# Patient Record
Sex: Male | Born: 1960 | Race: Black or African American | Hispanic: No | Marital: Single | Smoking: Never smoker
Health system: Southern US, Community
[De-identification: ages and names within clinical notes are randomized; demographics above are authoritative.]

---

## 2007-08-14 ENCOUNTER — Ambulatory Visit (HOSPITAL_COMMUNITY): Admission: RE | Admit: 2007-08-14 | Discharge: 2007-08-14 | Payer: Self-pay | Admitting: Family Medicine

## 2007-08-14 ENCOUNTER — Ambulatory Visit: Payer: Self-pay | Admitting: Family Medicine

## 2007-09-26 DIAGNOSIS — T148XXA Other injury of unspecified body region, initial encounter: Secondary | ICD-10-CM | POA: Insufficient documentation

## 2008-04-06 ENCOUNTER — Ambulatory Visit: Payer: Self-pay | Admitting: Family Medicine

## 2008-04-09 ENCOUNTER — Ambulatory Visit (HOSPITAL_COMMUNITY): Admission: RE | Admit: 2008-04-09 | Discharge: 2008-04-09 | Payer: Self-pay | Admitting: Family Medicine

## 2009-01-11 ENCOUNTER — Ambulatory Visit: Payer: Self-pay | Admitting: Family Medicine

## 2010-06-02 ENCOUNTER — Ambulatory Visit: Payer: Self-pay | Admitting: Family Medicine

## 2010-06-02 LAB — CONVERTED CEMR LAB
AST: 17 units/L (ref 0–37)
Alkaline Phosphatase: 65 units/L (ref 39–117)
BUN: 18 mg/dL (ref 6–23)
Basophils Absolute: 0 10*3/uL (ref 0.0–0.1)
Basophils Relative: 1 % (ref 0–1)
Creatinine, Ser: 1.09 mg/dL (ref 0.40–1.50)
Eosinophils Absolute: 0.1 10*3/uL (ref 0.0–0.7)
Eosinophils Relative: 4 % (ref 0–5)
Glucose, Bld: 85 mg/dL (ref 70–99)
HCT: 39.7 % (ref 39.0–52.0)
HDL: 35 mg/dL — ABNORMAL LOW (ref >39)
LDL Cholesterol: 98 mg/dL (ref 0–99)
Lymphocytes Relative: 37 % (ref 12–46)
MCHC: 33.5 g/dL (ref 30.0–36.0)
MCV: 82.7 fL (ref 78.0–100.0)
PSA: 0.55 ng/mL (ref 0.10–4.00)
Platelets: 218 10*3/uL (ref 150–400)
RDW: 13.5 % (ref 11.5–15.5)
Total CHOL/HDL Ratio: 4.5
Triglycerides: 118 mg/dL (ref <150)

## 2010-07-13 ENCOUNTER — Ambulatory Visit: Payer: Self-pay | Admitting: Family Medicine

## 2010-07-13 LAB — CONVERTED CEMR LAB
Basophils Absolute: 0 10*3/uL (ref 0.0–0.1)
Basophils Relative: 1 % (ref 0–1)
Eosinophils Relative: 2 % (ref 0–5)
HCT: 41.5 % (ref 39.0–52.0)
Lymphocytes Relative: 38 % (ref 12–46)
MCHC: 32.8 g/dL (ref 30.0–36.0)
Monocytes Absolute: 0.3 10*3/uL (ref 0.1–1.0)
Neutro Abs: 2.3 10*3/uL (ref 1.7–7.7)
Platelets: 227 10*3/uL (ref 150–400)
RDW: 13.6 % (ref 11.5–15.5)

## 2019-05-25 ENCOUNTER — Ambulatory Visit (HOSPITAL_COMMUNITY)
Admission: EM | Admit: 2019-05-25 | Discharge: 2019-05-25 | Disposition: A | Discharge status: Home / Self Care | Payer: BC Managed Care – PPO | Attending: Emergency Medicine | Admitting: Emergency Medicine

## 2019-05-25 ENCOUNTER — Other Ambulatory Visit: Payer: Self-pay

## 2019-05-25 ENCOUNTER — Ambulatory Visit (HOSPITAL_COMMUNITY): Payer: BC Managed Care – PPO

## 2019-05-25 ENCOUNTER — Ambulatory Visit (INDEPENDENT_AMBULATORY_CARE_PROVIDER_SITE_OTHER): Payer: BC Managed Care – PPO

## 2019-05-25 DIAGNOSIS — M25522 Pain in left elbow: Secondary | ICD-10-CM

## 2019-05-25 MED ORDER — IBUPROFEN 800 MG PO TABS: 800.0000 mg | ORAL_TABLET | Freq: Three times a day (TID) | ORAL | 0 refills | Status: AC

## 2019-05-25 NOTE — ED Triage Notes (Signed)
Pt fell one week ago and is saying still sore in his left elbow. Able to move and lift with no problem

## 2019-05-25 NOTE — Discharge Instructions (Signed)
Use anti-inflammatories for pain/swelling. You may take up to 800 mg Ibuprofen every 8 hours with food. You may supplement Ibuprofen with Tylenol [phone] mg every 8 hours.   Wear sling and keep elbow immobilized  Follow up with ortho for further imaging/MRI

## 2019-05-26 ENCOUNTER — Encounter (HOSPITAL_COMMUNITY): Payer: Self-pay | Admitting: Emergency Medicine

## 2019-05-26 NOTE — ED Provider Notes (Signed)
[facility]    CSN: [id number] Arrival date & time: 05/25/19  1132     History   Chief Complaint Chief Complaint  Patient presents with  . Arm Pain    HPI Devin Mejia is a 58 y.o. male no significant past medical history presenting today for evaluation of left elbow pain.  Patient states that approximately 1 week ago he slipped and fell and caught his fall with his left hand.  Denies landing directly on elbow.  Since he has had pain on the ulnar aspect of his elbow.  He denies difficulty bending or moving his elbow, notes that most of his pain is with pushing motions or carrying any weight through his elbow.  Denies numbness or tingling.  Denies previous injury.  Denies difficulty moving wrist or fingers.  He states that his symptoms have mildly improved, but given persistent came in for evaluation today.  HPI  History reviewed. No pertinent past medical history.  Patient Active Problem List   Diagnosis Date Noted  . HEMATOMA 09/26/2007    History reviewed. No pertinent surgical history.     Home Medications    Prior to Admission medications   Medication Sig Start Date End Date Taking? Authorizing Provider  ibuprofen (ADVIL) 800 MG tablet Take 1 tablet (800 mg total) by mouth 3 (three) times daily. 05/25/19   Jaylaa Gallion, Junius CreamerHallie C, PA-C    Family History No family history on file.  Social History Social History   Tobacco Use  . Smoking status: Not on file  Substance Use Topics  . Alcohol use: Not on file  . Drug use: Not on file     Allergies   Patient has no allergy information on record.   Review of Systems Review of Systems  Constitutional: Negative for fatigue and fever.  Eyes: Negative for redness, itching and visual disturbance.  Respiratory: Negative for shortness of breath.   Cardiovascular: Negative for chest pain and leg swelling.  Gastrointestinal: Negative for nausea and vomiting.  Musculoskeletal: Positive for arthralgias.  Negative for joint swelling and myalgias.  Skin: Negative for color change, rash and wound.  Neurological: Negative for dizziness, syncope, weakness, light-headedness and headaches.     Physical Exam Triage Vital Signs ED Triage Vitals [05/25/19 1259]  Enc Vitals Group     BP 138/88     Pulse Rate 74     Resp 16     Temp 98.2 F (36.8 C)     Temp Source Oral     SpO2 99 %     Weight      Height      Head Circumference      Peak Flow      Pain Score 3     Pain Loc      Pain Edu?      Excl. in GC?    No data found.  Updated Vital Signs BP 138/88 (BP Location: Right Arm)   Pulse 74   Temp 98.2 F (36.8 C) (Oral)   Resp 16   SpO2 99%   Visual Acuity Right Eye Distance:   Left Eye Distance:   Bilateral Distance:    Right Eye Near:   Left Eye Near:    Bilateral Near:     Physical Exam Vitals signs and nursing note reviewed.  Constitutional:      Appearance: He is well-developed.  HENT:     Head: Normocephalic and atraumatic.  Eyes:     Conjunctiva/sclera: Conjunctivae normal.  Neck:     Musculoskeletal: Neck supple.  Cardiovascular:     Rate and Rhythm: Normal rate and regular rhythm.     Heart sounds: No murmur.  Pulmonary:     Effort: Pulmonary effort is normal. No respiratory distress.     Breath sounds: Normal breath sounds.  Abdominal:     Palpations: Abdomen is soft.     Tenderness: There is no abdominal tenderness.  Musculoskeletal:     Comments: No obvious swelling deformity or erythema noted over elbow, tenderness to palpation over proximal ulnar aspect of elbow, nontender to palpation over proximal radius.  Full active range of motion of elbow  Grip strength 5/5 and equal bilaterally Radial pulse 2+  Skin:    General: Skin is warm and dry.  Neurological:     Mental Status: He is alert.      UC Treatments / Results  Labs (all labs ordered are listed, but only abnormal results are displayed) Labs Reviewed - No data to display  EKG  None  Radiology Dg Elbow Complete Left  Result Date: 05/25/2019 CLINICAL DATA:  Fall, pain EXAM: LEFT ELBOW - COMPLETE 3+ VIEW COMPARISON:  None. FINDINGS: No definite fracture or dislocation of the left elbow. There is a small, possibly corticated ossicle about the medial aspect of the ulna adjacent to the joint space seen in frontal view, possibly a small avulsion fragment. No elbow joint effusion. No significant arthrosis. IMPRESSION: No definite fracture or dislocation of the left elbow. There is a small, possibly corticated ossicle about the medial aspect of the ulna adjacent to the joint space seen in frontal view, possibly a small avulsion fragment. No elbow joint effusion. No significant arthrosis. MRI may be used to further evaluate for ligamentous integrity and occult fracture if suspected. Electronically Signed   By: Lauralyn PrimesAlex  Bibbey M.D.   On: 05/25/2019 13:40    Procedures Procedures (including critical care time)  Medications Ordered in UC Medications - No data to display  Initial Impression / Assessment and Plan / UC Course  I have reviewed the triage vital signs and the nursing notes.  Pertinent labs & imaging results that were available during my care of the patient were reviewed by me and considered in my medical decision making (see chart for details).     Suspicious area on elbow suggestive of possible avulsion fragment to proximal ulna.  Recommended MRI.  Offered patient sling to use for immobilization, patient declined and stated he had alone at home.  Recommended following up with orthopedics for further evaluation and treatment.  Provided contact information.  Recommended taking anti-inflammatories and icing in the interim.  Given has full active range of motion and pain improving, will defer splinting at this time given avulsion small.  Has full range of motion of forearm and wrist distally, do not suspect ligamentous injury, but cannot rule out.  Stressed importance of  following up with orthopedics multiple times as patient seemed in a rush to leave after results given.  Discussed strict return precautions. Patient verbalized understanding and is agreeable with plan.  Final Clinical Impressions(s) / UC Diagnoses   Final diagnoses:  Left elbow pain     Discharge Instructions     Use anti-inflammatories for pain/swelling. You may take up to 800 mg Ibuprofen every 8 hours with food. You may supplement Ibuprofen with Tylenol [phone] mg every 8 hours.   Wear sling and keep elbow immobilized  Follow up with ortho for further imaging/MRI  ED Prescriptions    Medication Sig Dispense Auth. Provider   ibuprofen (ADVIL) 800 MG tablet Take 1 tablet (800 mg total) by mouth 3 (three) times daily. 21 tablet Tatem Holsonback, Laona C, PA-C     Controlled Substance Prescriptions [place] Controlled Substance Registry consulted? Not Applicable   Janith Lima, [place] 05/26/19 1717

## 2020-03-04 ENCOUNTER — Ambulatory Visit: Payer: BC Managed Care – PPO | Attending: Internal Medicine

## 2020-03-04 DIAGNOSIS — Z23 Encounter for immunization: Secondary | ICD-10-CM

## 2020-03-04 NOTE — Progress Notes (Signed)
   Covid-19 Vaccination Clinic  Name:  EH SESAY    MRN: [id number] DOB: 01/13/1961  03/04/2020  Mr. Poynter was observed post Covid-19 immunization for 15 minutes without incident. He was provided with Vaccine Information Sheet and instruction to access the V-Safe system.   Mr. Rennert was instructed to call 911 with any severe reactions post vaccine: Marland Kitchen Difficulty breathing  . Swelling of face and throat  . A fast heartbeat  . A bad rash all over body  . Dizziness and weakness   Immunizations Administered    Name Date Dose VIS Date Route   Pfizer COVID-19 Vaccine 03/04/2020  4:29 PM 0.3 mL 11/07/2019 Intramuscular   Manufacturer: [employer], [employer]   Lot: [id number]   NDC: [address]-4

## 2020-03-31 ENCOUNTER — Ambulatory Visit: Payer: BC Managed Care – PPO | Attending: Internal Medicine

## 2020-03-31 DIAGNOSIS — Z23 Encounter for immunization: Secondary | ICD-10-CM

## 2020-03-31 NOTE — Progress Notes (Signed)
   Covid-19 Vaccination Clinic  Name:  Devin Mejia    MRN: [id number] DOB: 07/11/1961  03/31/2020  Mr. Devin Mejia was observed post Covid-19 immunization for 15 minutes without incident. He was provided with Vaccine Information Sheet and instruction to access the V-Safe system.   Mr. Devin Mejia was instructed to call 911 with any severe reactions post vaccine: Marland Kitchen Difficulty breathing  . Swelling of face and throat  . A fast heartbeat  . A bad rash all over body  . Dizziness and weakness   Immunizations Administered    Name Date Dose VIS Date Route   Pfizer COVID-19 Vaccine 03/31/2020  4:30 PM 0.3 mL 01/21/2019 Intramuscular   Manufacturer: [employer], [employer]   Lot: [id number]   NDC: [address]-9

## 2020-12-06 IMAGING — DX LEFT ELBOW - COMPLETE 3+ VIEW
4 series · 4 of 4 positions shown · non-contrast
Comparison: None.

CLINICAL DATA: Fall, pain

EXAM:
LEFT ELBOW - COMPLETE 3+ VIEW

[elbow ap]
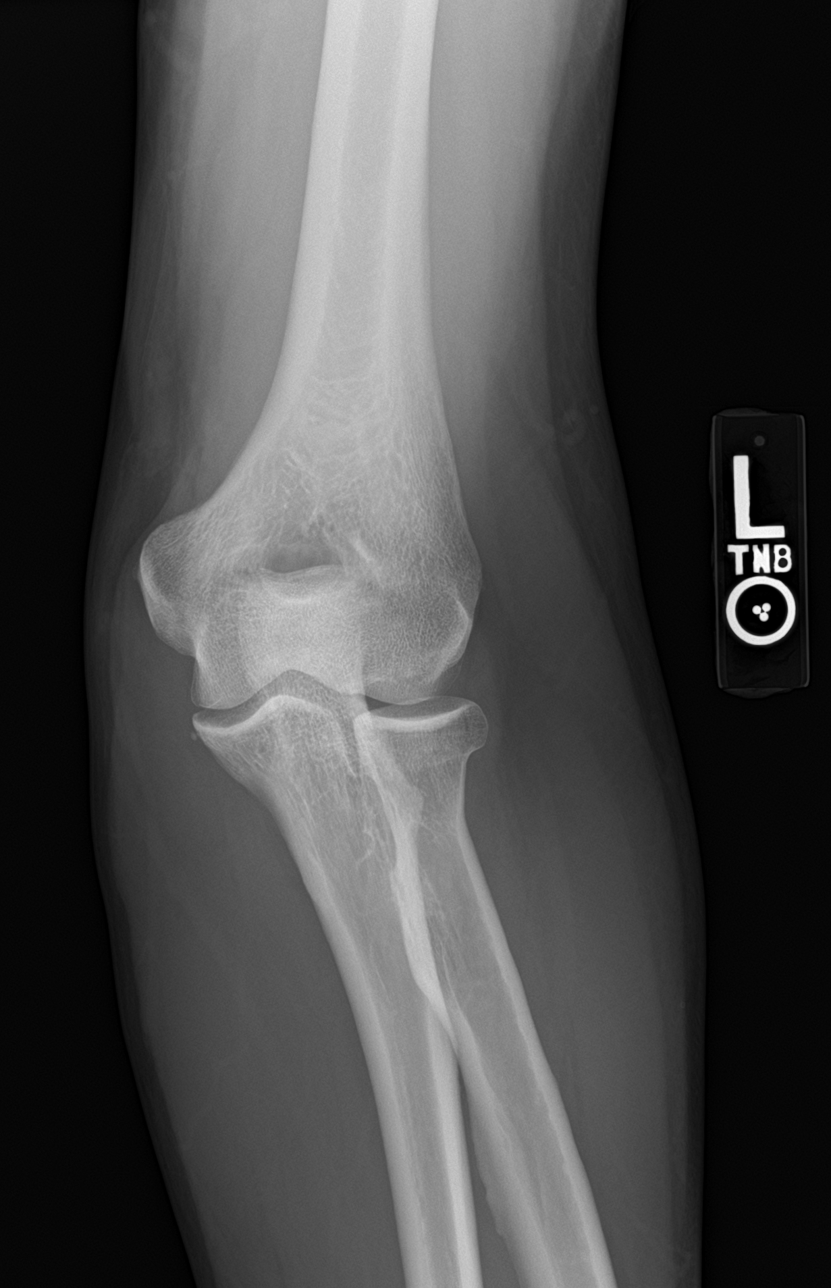

[elbow obl (1 of 2)]
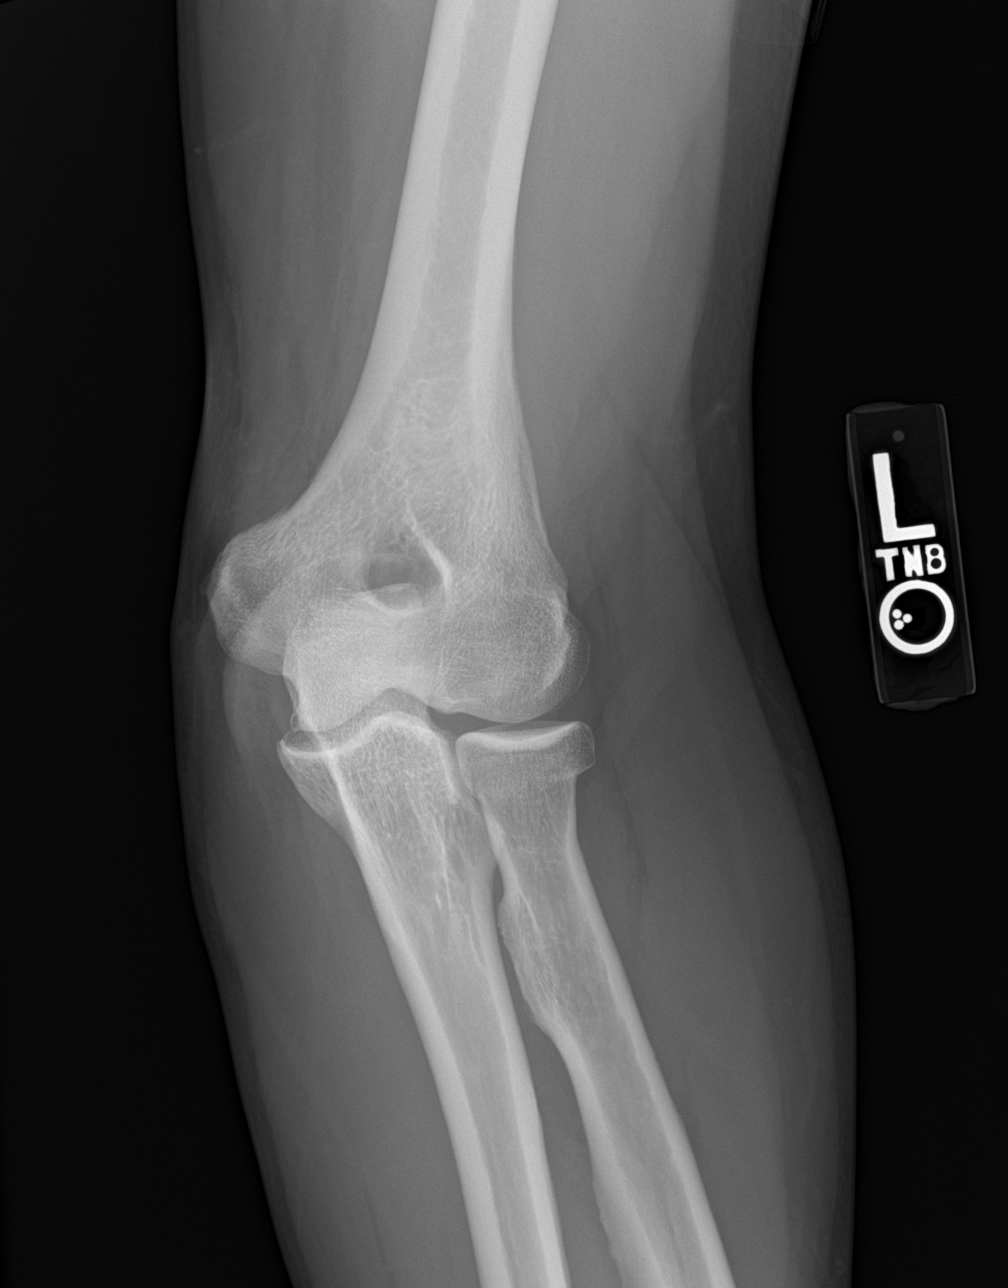

[elbow obl (2 of 2)]
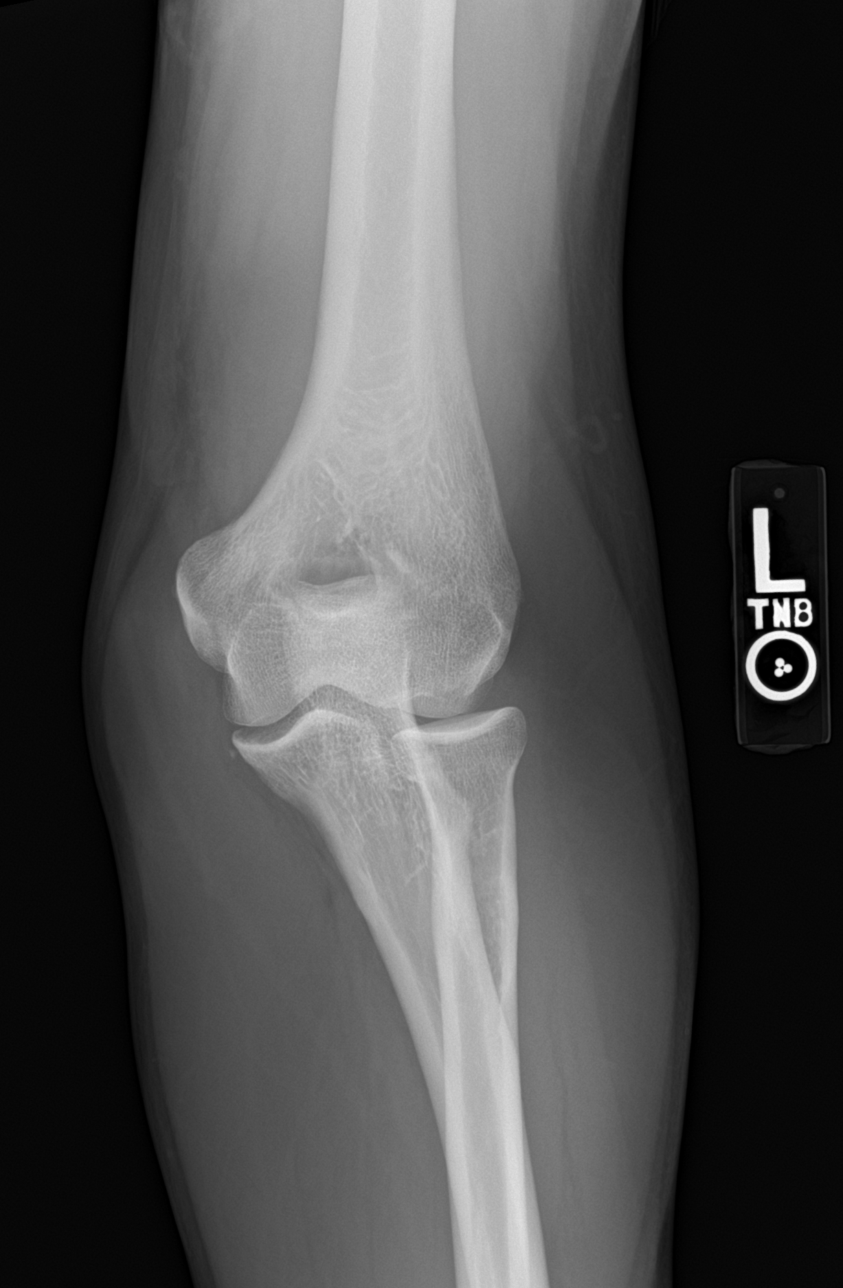

[elbow lat]
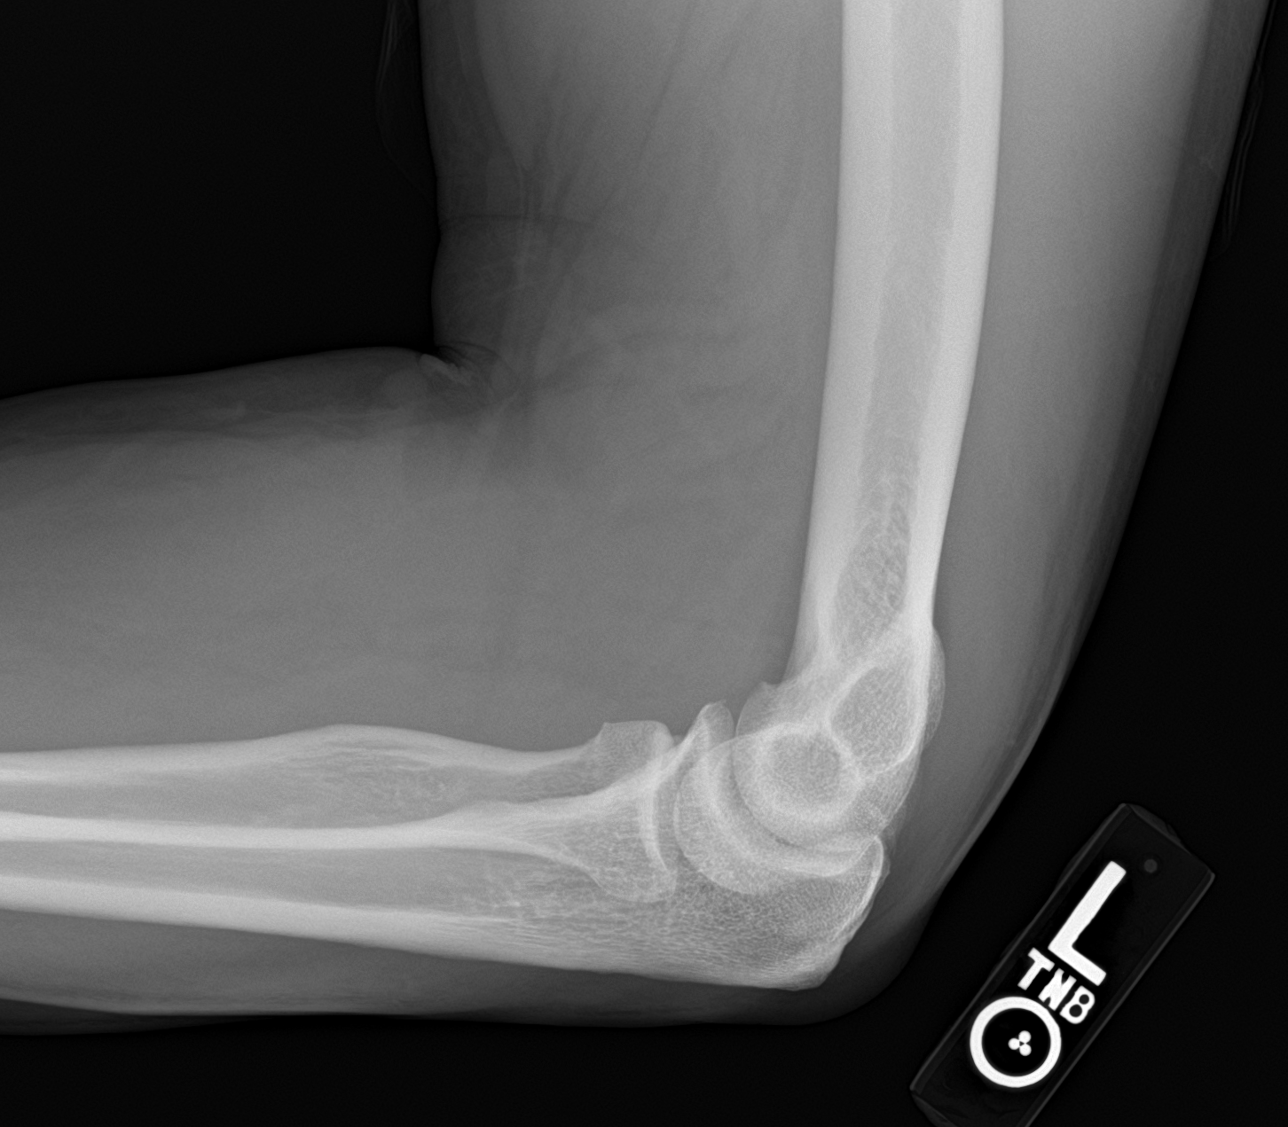

[4 of 4 positions shown; findings below may reference images not displayed]

FINDINGS: No definite fracture or dislocation of the left elbow. There is a
small, possibly corticated ossicle about the medial aspect of the
ulna adjacent to the joint space seen in frontal view, possibly a
small avulsion fragment. No elbow joint effusion. No significant
arthrosis.
IMPRESSION: No definite fracture or dislocation of the left elbow. There is a
small, possibly corticated ossicle about the medial aspect of the
ulna adjacent to the joint space seen in frontal view, possibly a
small avulsion fragment. No elbow joint effusion. No significant
arthrosis. MRI may be used to further evaluate for ligamentous
integrity and occult fracture if suspected.

## 2023-06-11 ENCOUNTER — Ambulatory Visit: Payer: BC Managed Care – PPO | Admitting: Physician Assistant

## 2023-06-11 LAB — HEMOGLOBIN A1C: Hemoglobin A1C: 5.4

## 2023-06-11 LAB — AMB RESULTS CONSOLE CBG: Glucose: 102

## 2023-06-11 NOTE — Progress Notes (Signed)
Pt needs PCP. Pt given SDOH handouts with food and utilites

## 2023-06-29 ENCOUNTER — Encounter: Payer: Self-pay | Admitting: *Deleted

## 2023-06-29 NOTE — Progress Notes (Signed)
Pt attended 06/11/23 screening event where his b/p was 128/82, his blood sugar was 102, and his A1C was 5.4. At the event the pt shared he did not have a PCP and did have food and utility insecurities for which he was given resources. During the follow-up call, pt stated he had the community clinic flyer and the resources but did not have the [facility] Now flyer which also includes access to virtual and urgent care contacts as well as direct appt scheduling options. Pt verbalized understanding of the benefit of having PCP for his care and as a "go to" provider when needed and noted he appreciated the follow up and info being mailed to his home.

## 2023-08-27 ENCOUNTER — Encounter: Payer: Self-pay | Admitting: *Deleted

## 2023-08-27 NOTE — Progress Notes (Signed)
Pt attended 06/11/23 screening event where his b/p was 128/82 and his blood sugar was 102 and his A1C was 5.4. After the initial event f/u, when pt noted he had been given resources at the event for his food and utilities insecurities, pt requested PCP info including [facility] Care Now flyer and Community primary care clinic flyers so that he could read through them and decide what he wanted to do about establishing care with a PCP. Today, during the 60 day f/u call, pt stated "I thought I might need a dr a little earlier this month but everything turned out ok." Health equity team member reminded pt that ideally, he could establish care with a PCP now so that when he does not feel well, he will already have a provider with whom he is established to help work him in to be seen and provide the health care he needs. Pt verbalized understanding of this PCP establishment process and stated he was still deciding which dr or clinic he would like to see, still had the PCP flyers that the team had mailed to him last month as well as the food resources for pantries and meals and did not need any additional info mailed to him now. Health equity team member told pt that the health equity team would check back with him in the future to see if he had made any PCP decisions or had any other SDOH insecurities at that time. Pt thanked the caller.

## 2024-01-10 NOTE — Progress Notes (Signed)
 Pt attended 06/11/23 screening event with BP of 128/82 and A1C & Blood Glucose levels were WNL. Pt noted at event that he does not have a PCP or insurance. At event pt indicated food and utility SDOH needs. Pt also noted that he is not a smoker.  Per initial f/u pt stated that he was going to find a PCP and [facility] mailed Get Care Now and food resources.  Per chart review pt does have a PCP, insurance, and is not a smoker. There are no [facility]-visible encounters at this time.   During 01/10/24 phone call with pt he stated that he does have insurance and indicated a want to get set up with PCP.  Called pt back on 01/11/24 to try and get scheduled with a PCP, left vm. Called pt one more time on 01/15/24 and left vm. Pt called back and requested a callback for 01/17/24. Was unable to speak with pt, each call he ha stated he is not available to speak. A letter was sent with community primary care flyers and the Get Care Now flyer.  Additional pt f/u to be scheduled per health equity protocol.

## 2024-03-26 ENCOUNTER — Encounter (HOSPITAL_COMMUNITY): Payer: Self-pay | Admitting: *Deleted

## 2024-03-26 ENCOUNTER — Ambulatory Visit (HOSPITAL_COMMUNITY)
Admission: EM | Admit: 2024-03-26 | Discharge: 2024-03-26 | Disposition: A | Discharge status: Home / Self Care | Attending: Emergency Medicine | Admitting: Emergency Medicine

## 2024-03-26 ENCOUNTER — Other Ambulatory Visit: Payer: Self-pay

## 2024-03-26 DIAGNOSIS — H66012 Acute suppurative otitis media with spontaneous rupture of ear drum, left ear: Secondary | ICD-10-CM | POA: Diagnosis not present

## 2024-03-26 DIAGNOSIS — H66001 Acute suppurative otitis media without spontaneous rupture of ear drum, right ear: Secondary | ICD-10-CM | POA: Diagnosis not present

## 2024-03-26 MED ORDER — AMOXICILLIN-POT CLAVULANATE 875-125 MG PO TABS: 1.0000 | ORAL_TABLET | Freq: Two times a day (BID) | ORAL | 0 refills | Status: AC

## 2024-03-26 NOTE — ED Provider Notes (Signed)
 [facility]    CSN: [id number] Arrival date & time: 03/26/24  1013      History   Chief Complaint Chief Complaint  Patient presents with   Otalgia    HPI Devin Mejia is a 63 y.o. male.   Patient presents with bilateral ear pain that began on 4/26.  Patient states that he had some mild congestion and sinus pressure over the last few days that had resolved, but the ear pain persisted and became worse.  Patient denies taking any medications for symptoms, but states that he did apply some over-the-counter ear oil without relief.  Patient denies sticking anything in his ears recently such as Q-tips.  Denies fever.   Otalgia   History reviewed. No pertinent past medical history.  Patient Active Problem List   Diagnosis Date Noted   Contusion 09/26/2007    History reviewed. No pertinent surgical history.     Home Medications    Prior to Admission medications   Medication Sig Start Date End Date Taking? Authorizing Provider  amoxicillin-clavulanate (AUGMENTIN) 875-125 MG tablet Take 1 tablet by mouth every 12 (twelve) hours. 03/26/24  Yes Rosevelt Constable, Eulamae Greenstein A, NP  ibuprofen  (ADVIL ) 800 MG tablet Take 1 tablet (800 mg total) by mouth 3 (three) times daily. 05/25/19  Yes Wieters, Hallie C, PA-C    Family History History reviewed. No pertinent family history.  Social History Social History   Tobacco Use   Smoking status: Never   Smokeless tobacco: Never     Allergies   Patient has no known allergies.   Review of Systems Review of Systems  HENT:  Positive for ear pain.    Per HPI  Physical Exam Triage Vital Signs ED Triage Vitals  Encounter Vitals Group     BP 03/26/24 1059 129/82     Systolic BP Percentile --      Diastolic BP Percentile --      Pulse Rate 03/26/24 1059 72     Resp 03/26/24 1059 20     Temp 03/26/24 1059 98 F (36.7 C)     Temp src --      SpO2 03/26/24 1059 95 %     Weight --      Height --      Head Circumference  --      Peak Flow --      Pain Score 03/26/24 1058 10     Pain Loc --      Pain Education --      Exclude from Growth Chart --    No data found.  Updated Vital Signs BP 129/82   Pulse 72   Temp 98 F (36.7 C)   Resp 20   SpO2 95%   Visual Acuity Right Eye Distance:   Left Eye Distance:   Bilateral Distance:    Right Eye Near:   Left Eye Near:    Bilateral Near:     Physical Exam Vitals and nursing note reviewed.  Constitutional:      General: He is awake. He is not in acute distress.    Appearance: Normal appearance. He is well-developed and well-groomed. He is not ill-appearing.  HENT:     Right Ear: Tympanic membrane is erythematous and bulging.     Left Ear: Drainage present. Tympanic membrane is perforated.     Nose: Nose normal.     Mouth/Throat:     Mouth: Mucous membranes are moist.     Pharynx: Oropharynx is clear.  Neurological:     Mental Status: He is alert.  Psychiatric:        Behavior: Behavior is cooperative.      UC Treatments / Results  Labs (all labs ordered are listed, but only abnormal results are displayed) Labs Reviewed - No data to display  EKG   Radiology No results found.  Procedures Procedures (including critical care time)  Medications Ordered in UC Medications - No data to display  Initial Impression / Assessment and Plan / UC Course  I have reviewed the triage vital signs and the nursing notes.  Pertinent labs & imaging results that were available during my care of the patient were reviewed by me and considered in my medical decision making (see chart for details).     Patient is well-appearing.  Vitals are stable.  Upon assessment right TM is erythematous and bulging.  Left TM is perforated and there is some drainage noted in the ear canal.  Prescribed Augmentin for otitis media.  Recommended Tylenol ibuprofen  as needed for pain.  Discussed return precautions. Final Clinical Impressions(s) / UC Diagnoses   Final  diagnoses:  Non-recurrent acute suppurative otitis media of right ear without spontaneous rupture of tympanic membrane  Non-recurrent acute suppurative otitis media of left ear with spontaneous rupture of tympanic membrane     Discharge Instructions      Start taking Augmentin twice daily for 7 days for ear infection. Alternate between 650 mg of Tylenol and 400 mg of ibuprofen  every 6-8 hours as needed for pain. Return here as needed.    ED Prescriptions     Medication Sig Dispense Auth. Provider   amoxicillin-clavulanate (AUGMENTIN) 875-125 MG tablet Take 1 tablet by mouth every 12 (twelve) hours. 14 tablet Levora Reas A, NP      PDMP not reviewed this encounter.   Levora Reas A, NP 03/26/24 1133

## 2024-03-26 NOTE — ED Triage Notes (Signed)
 PT DOB and Full Name confirmed

## 2024-03-26 NOTE — ED Triage Notes (Signed)
 PT reports ear pain since weekend.

## 2024-03-26 NOTE — Discharge Instructions (Addendum)
 Start taking Augmentin twice daily for 7 days for ear infection. Alternate between 650 mg of Tylenol and 400 mg of ibuprofen  every 6-8 hours as needed for pain. Return here as needed.
